# Patient Record
Sex: Male | Born: 2000 | Race: White | Hispanic: No | Marital: Single | Smoking: Current every day smoker
Health system: Southern US, Community
[De-identification: ages and names within clinical notes are randomized; demographics above are authoritative.]

## PROBLEM LIST (undated history)

## (undated) DIAGNOSIS — F909 Attention-deficit hyperactivity disorder, unspecified type: Secondary | ICD-10-CM

## (undated) HISTORY — DX: Attention-deficit hyperactivity disorder, unspecified type: F90.9

## (undated) HISTORY — PX: OTHER SURGICAL HISTORY: SHX169

## (undated) HISTORY — PX: BRAIN SURGERY: SHX531

---
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
## 2022-07-16 ENCOUNTER — Encounter: Payer: Self-pay | Admitting: Neurology

## 2022-07-26 ENCOUNTER — Encounter: Payer: Self-pay | Admitting: Neurology

## 2022-07-26 ENCOUNTER — Ambulatory Visit: Payer: BLUE CROSS/BLUE SHIELD | Admitting: Neurology

## 2022-07-26 VITALS — BP 101/61 | HR 60 | Ht 72.0 in | Wt 103.4 lb

## 2022-07-26 DIAGNOSIS — Z87898 Personal history of other specified conditions: Secondary | ICD-10-CM | POA: Diagnosis not present

## 2022-07-26 DIAGNOSIS — R569 Unspecified convulsions: Secondary | ICD-10-CM

## 2022-07-26 MED ORDER — LEVETIRACETAM 500 MG PO TABS: 500.0000 mg | ORAL_TABLET | Freq: Two times a day (BID) | ORAL | 11 refills | Status: DC

## 2022-07-26 NOTE — Patient Instructions (Signed)
Good to meet you.  Schedule MRI brain with and without contrast  2. Schedule 1-hour EEG  3. Continue Levetiracetam (Keppra) 500mg : Take 1 tablet twice a day  4. Follow-up in 3 months, call for any changes   Seizure Precautions: 1. If medication has been prescribed for you to prevent seizures, take it exactly as directed.  Do not stop taking the medicine without talking to your doctor first, even if you have not had a seizure in a long time.   2. Avoid activities in which a seizure would cause danger to yourself or to others.  Don't operate dangerous machinery, swim alone, or climb in high or dangerous places, such as on ladders, roofs, or girders.  Do not drive unless your doctor says you may.  3. If you have any warning that you may have a seizure, lay down in a safe place where you can't hurt yourself.    4.  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   Please refer to the following link on the [employer]ile[employer] of [employer]'s website for more information: http://www.epilepsyfoundation.org/answerplace/Social/driving/drivingu.cfm   5.  Maintain good sleep hygiene. Avoid alcohol.  6.  Contact your doctor if you have any problems that may be related to the medicine you are taking.  7.  Call 911 and bring the patient back to the ED if:        A.  The seizure lasts longer than 5 minutes.       B.  The patient doesn't awaken shortly after the seizure  C.  The patient has new problems such as difficulty seeing, speaking or moving  D.  The patient was injured during the seizure  E.  The patient has a temperature over 102 F (39C)  F.  The patient vomited and now is having trouble breathing
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
## 2022-08-04 ENCOUNTER — Ambulatory Visit: Payer: BLUE CROSS/BLUE SHIELD | Admitting: Neurology

## 2022-08-04 DIAGNOSIS — Z87898 Personal history of other specified conditions: Secondary | ICD-10-CM

## 2022-08-04 DIAGNOSIS — R569 Unspecified convulsions: Secondary | ICD-10-CM | POA: Diagnosis not present

## 2022-08-11 ENCOUNTER — Encounter: Payer: Self-pay | Admitting: Neurology

## 2022-08-13 ENCOUNTER — Other Ambulatory Visit: Payer: BLUE CROSS/BLUE SHIELD

## 2022-08-15 NOTE — Procedures (Signed)
ELECTROENCEPHALOGRAM REPORT  Date of Study: 08/04/2022  Patient's Name: Miguel Harper MRN: [id number] Date of Birth: 09/01/2000  Referring Provider: Dr. Patrcia Dolly  Clinical History: This is a 22 year old man with a history of right frontal epidural hematoma s/p craniotomy at age 71,with new onset nocturnal convulsion. EEG for classification.  Medications: Keppra  Technical Summary: A multichannel digital 1-hour EEG recording measured by the international 10-20 system with electrodes applied with paste and impedances below 5000 ohms performed in our laboratory with EKG monitoring in an awake and asleep patient.  Hyperventilation and photic stimulation were performed.  The digital EEG was referentially recorded, reformatted, and digitally filtered in a variety of bipolar and referential montages for optimal display.    Description: The patient is awake and asleep during the recording.  During maximal wakefulness, there is a symmetric, medium voltage 10 Hz posterior dominant rhythm that attenuates with eye opening.  The record is symmetric.  During drowsiness and sleep, there is an increase in theta slowing of the background.  Vertex waves and symmetric sleep spindles were seen. Hyperventilation and photic stimulation did not elicit any abnormalities.  There were no epileptiform discharges or electrographic seizures seen.    EKG lead was unremarkable.  Impression: This 1-hour awake and asleep EEG is normal.    Clinical Correlation: A normal EEG does not exclude a clinical diagnosis of epilepsy.  If further clinical questions remain, prolonged EEG may be helpful.  Clinical correlation is advised.   Patrcia Dolly, M.D.

## 2022-08-20 ENCOUNTER — Other Ambulatory Visit: Payer: BLUE CROSS/BLUE SHIELD

## 2022-08-20 ENCOUNTER — Telehealth: Payer: Self-pay

## 2022-08-20 NOTE — Telephone Encounter (Signed)
Pt called informed that EEG is normal however it is not like a pregnancy test that is positive or negative, just a snapshot of his brain waves during the test. Proceed with MRI as scheduled pt stated that his MRI has been rescheduled twice if they reschedule again he will let us know . Continue Keppra

## 2022-08-20 NOTE — Telephone Encounter (Signed)
-----   Message from Van Clines sent at 08/18/2022  9:36 AM EDT ----- Pls let him know the EEG is normal however it is not like a pregnancy test that is positive or negative, just a snapshot of his brain waves during the test. Proceed with MRI as scheduled. Continue Keppra. Thanks

## 2022-09-06 ENCOUNTER — Ambulatory Visit
Admission: RE | Admit: 2022-09-06 | Discharge: 2022-09-06 | Disposition: A | Discharge status: Home / Self Care | Payer: BLUE CROSS/BLUE SHIELD | Source: Ambulatory Visit | Attending: Neurology | Admitting: Neurology

## 2022-09-06 DIAGNOSIS — Z87898 Personal history of other specified conditions: Secondary | ICD-10-CM

## 2022-09-06 DIAGNOSIS — R569 Unspecified convulsions: Secondary | ICD-10-CM

## 2022-09-06 MED ORDER — GADOPICLENOL 0.5 MMOL/ML IV SOLN
5.0000 mL | Freq: Once | INTRAVENOUS | Status: AC | PRN
  Administered 2022-09-06: 5 mL via INTRAVENOUS

## 2022-09-27 ENCOUNTER — Telehealth: Payer: Self-pay

## 2022-09-27 NOTE — Telephone Encounter (Signed)
Open in error

## 2022-09-28 ENCOUNTER — Telehealth: Payer: Self-pay | Admitting: Neurology

## 2022-09-28 NOTE — Telephone Encounter (Signed)
Pt called an informed that brain MRI did not show any new changes, no tumor, stroke, or bleed. It showed the small scar from his prior injury,

## 2022-09-28 NOTE — Telephone Encounter (Signed)
Returning a call to the office he states that Dr Karel Jarvis called \  He wants to have a call back after 3:00

## 2022-11-02 ENCOUNTER — Telehealth (INDEPENDENT_AMBULATORY_CARE_PROVIDER_SITE_OTHER): Payer: BLUE CROSS/BLUE SHIELD | Admitting: Neurology

## 2022-11-02 ENCOUNTER — Encounter: Payer: Self-pay | Admitting: Neurology

## 2022-11-02 VITALS — Ht 72.0 in | Wt 110.0 lb

## 2022-11-02 DIAGNOSIS — R569 Unspecified convulsions: Secondary | ICD-10-CM

## 2022-11-02 DIAGNOSIS — Z87898 Personal history of other specified conditions: Secondary | ICD-10-CM

## 2022-11-02 MED ORDER — LEVETIRACETAM 500 MG PO TABS: 500.0000 mg | ORAL_TABLET | Freq: Two times a day (BID) | ORAL | 3 refills | Status: AC
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
## 2022-11-02 NOTE — Patient Instructions (Signed)
Good to see you doing well. Continue Keppra (Levetiracetam) 500mg twice a day. Follow-up in 6 months, call for any changes.   Seizure Precautions: 1. If medication has been prescribed for you to prevent seizures, take it exactly as directed.  Do not stop taking the medicine without talking to your doctor first, even if you have not had a seizure in a long time.   2. Avoid activities in which a seizure would cause danger to yourself or to others.  Don't operate dangerous machinery, swim alone, or climb in high or dangerous places, such as on ladders, roofs, or girders.  Do not drive unless your doctor says you may.  3. If you have any warning that you may have a seizure, lay down in a safe place where you can't hurt yourself.    4.  No driving for 6 months from last seizure, as per [place] state law.   Please refer to the following link on the [employer]ile[employer] of [employer]'s website for more information: http://www.epilepsyfoundation.org/answerplace/Social/driving/drivingu.cfm   5.  Maintain good sleep hygiene. Avoid alcohol.  6.  Contact your doctor if you have any problems that may be related to the medicine you are taking.  7.  Call 911 and bring the patient back to the ED if:        A.  The seizure lasts longer than 5 minutes.       B.  The patient doesn't awaken shortly after the seizure  C.  The patient has new problems such as difficulty seeing, speaking or moving  D.  The patient was injured during the seizure  E.  The patient has a temperature over 102 F (39C)  F.  The patient vomited and now is having trouble breathing        

## 2022-11-25 ENCOUNTER — Encounter: Payer: Self-pay | Admitting: Neurology

## 2023-06-01 ENCOUNTER — Ambulatory Visit: Payer: BLUE CROSS/BLUE SHIELD | Admitting: Neurology

## 2023-06-01 ENCOUNTER — Encounter: Payer: Self-pay | Admitting: Neurology

## 2023-06-01 DIAGNOSIS — Z029 Encounter for administrative examinations, unspecified: Secondary | ICD-10-CM

## 2023-06-02 ENCOUNTER — Ambulatory Visit: Payer: BLUE CROSS/BLUE SHIELD | Admitting: Neurology
